# Patient Record
Sex: Male | Born: 1965 | Race: White | Hispanic: No | Marital: Married | Smoking: Never smoker
Health system: Southern US, Community
[De-identification: ages and names within clinical notes are randomized; demographics above are authoritative.]

---

## 1999-06-01 ENCOUNTER — Encounter: Payer: Self-pay | Admitting: Family Medicine

## 1999-06-01 ENCOUNTER — Encounter: Admission: RE | Admit: 1999-06-01 | Discharge: 1999-06-01 | Payer: Self-pay | Admitting: Family Medicine

## 2001-08-06 ENCOUNTER — Encounter: Admission: RE | Admit: 2001-08-06 | Discharge: 2001-08-06 | Payer: Self-pay | Admitting: Family Medicine

## 2001-08-06 ENCOUNTER — Encounter: Payer: Self-pay | Admitting: Family Medicine

## 2006-12-19 ENCOUNTER — Ambulatory Visit: Payer: Self-pay | Admitting: Internal Medicine

## 2006-12-25 ENCOUNTER — Ambulatory Visit: Payer: Self-pay | Admitting: Internal Medicine

## 2010-05-18 NOTE — Assessment & Plan Note (Signed)
[facility]                         GASTROENTEROLOGY OFFICE NOTE   David, Rojas                         MRN:          [id number]  DATE:12/19/2006                            DOB:          1965/04/29    REFERRING PHYSICIAN:  Loraine Leriche C. Vernie Ammons, M.D.   REASON FOR CONSULTATION:  Rectal bleeding.   HISTORY:  This is a pleasant 45 year old white male with no significant  past medical history.  He is referred through the courtesy of Dr.  Vernie Ammons regarding minor rectal bleeding.  The patient reports having had  problems with left hip pain for several months.  He subsequently  developed a change in his urinary flow pattern.  He saw Dr. Vernie Ammons and  was diagnosed with prostatitis.  He was placed on ciprofloxacin.  Rectal  exam at that time was unremarkable.  The patient reports about 3 weeks  ago noticing blood mixed with his stool.  This occurred 2 to 3 times  over the course of a week.  There was no associated change in bowel  habits, difficulty with bowel habits, rectal or abdominal pain.  His  appetite and weight have been stable.  He has had no bleeding for the  past few weeks.  No prior history of GI problems or GI evaluation.   PAST MEDICAL HISTORY:  None.   PAST SURGICAL HISTORY:  None.   ALLERGIES:  No known drug allergies.   CURRENT MEDICATIONS:  Cipro 500 mg b.i.d.   FAMILY HISTORY:  Paternal grandmother with colon cancer in her 14s.   SOCIAL HISTORY:  The patient is married with 4 children.  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  He smokes less than a pack of  cigarettes a day and has an alcoholic beverage with dinner.   REVIEW OF SYSTEMS:  Per diagnostic evaluation form.   PHYSICAL EXAM:  Well-appearing male in no acute distress.  Blood pressure is 120/78, heart rate is 68, weight 209.4 pounds.  He is  5 feet 8 inches in height.  HEENT:  Sclerae  anicteric.  Conjunctivae pink.  Oral mucosa is intact.  No adenopathy.  LUNGS:  Clear.  HEART:  Regular.  ABDOMEN:  Soft without tenderness, mass, or hernia.  RECTAL:  Per Dr. Vernie Ammons, negative, not repeated.   IMPRESSION:  This is a 45 year old gentleman with a several-day history  of blood mixed with stool.  No other associated symptoms.  This may be  due to internal hemorrhoids.  However, neoplasia must be excluded.   RECOMMENDATIONS:  Colonoscopy with polypectomy if necessary.  The nature  of the procedure, as well as the risks, benefits, and alternatives have  been reviewed in detail.  He understood and agreed to proceed.     Wilhemina Bonito. Marina Goodell, MD  Electronically Signed    JNP/MedQ  DD: 12/19/2006  DT: 12/20/2006  Job #: [id number]   cc:   Veverly Fells. Vernie Ammons, M.D.

## 2014-04-14 ENCOUNTER — Ambulatory Visit (INDEPENDENT_AMBULATORY_CARE_PROVIDER_SITE_OTHER): Payer: PRIVATE HEALTH INSURANCE | Admitting: Urgent Care

## 2014-04-14 ENCOUNTER — Ambulatory Visit (INDEPENDENT_AMBULATORY_CARE_PROVIDER_SITE_OTHER): Payer: PRIVATE HEALTH INSURANCE

## 2014-04-14 VITALS — BP 128/80 | HR 64 | Temp 98.0°F | Resp 24 | Ht 67.5 in | Wt 205.2 lb

## 2014-04-14 DIAGNOSIS — S50811A Abrasion of right forearm, initial encounter: Secondary | ICD-10-CM | POA: Diagnosis not present

## 2014-04-14 DIAGNOSIS — S59911A Unspecified injury of right forearm, initial encounter: Secondary | ICD-10-CM | POA: Diagnosis not present

## 2014-04-14 DIAGNOSIS — S8011XA Contusion of right lower leg, initial encounter: Secondary | ICD-10-CM

## 2014-04-14 DIAGNOSIS — M79651 Pain in right thigh: Secondary | ICD-10-CM | POA: Diagnosis not present

## 2014-04-14 DIAGNOSIS — M79631 Pain in right forearm: Secondary | ICD-10-CM

## 2014-04-14 DIAGNOSIS — M62838 Other muscle spasm: Secondary | ICD-10-CM

## 2014-04-14 DIAGNOSIS — M6248 Contracture of muscle, other site: Secondary | ICD-10-CM | POA: Diagnosis not present

## 2014-04-14 DIAGNOSIS — S40021A Contusion of right upper arm, initial encounter: Secondary | ICD-10-CM

## 2014-04-14 MED ORDER — CYCLOBENZAPRINE HCL 5 MG PO TABS: 5.0000 mg | ORAL_TABLET | Freq: Three times a day (TID) | ORAL | Status: AC | PRN

## 2014-04-14 MED ORDER — MELOXICAM 7.5 MG PO TABS: 7.5000 mg | ORAL_TABLET | Freq: Every day | ORAL | Status: AC

## 2014-04-14 NOTE — Patient Instructions (Signed)
Contusion °A contusion is a deep bruise. Contusions are the result of an injury that caused bleeding under the skin. The contusion may turn blue, purple, or yellow. Minor injuries will give you a painless contusion, but more severe contusions may stay painful and swollen for a few weeks.  °CAUSES  °A contusion is usually caused by a blow, trauma, or direct force to an area of the body. °SYMPTOMS  °· Swelling and redness of the injured area. °· Bruising of the injured area. °· Tenderness and soreness of the injured area. °· Pain. °DIAGNOSIS  °The diagnosis can be made by taking a history and physical exam. An X-ray, CT scan, or MRI may be needed to determine if there were any associated injuries, such as fractures. °TREATMENT  °Specific treatment will depend on what area of the body was injured. In general, the best treatment for a contusion is resting, icing, elevating, and applying cold compresses to the injured area. Over-the-counter medicines may also be recommended for pain control. Ask your caregiver what the best treatment is for your contusion. °HOME CARE INSTRUCTIONS  °· Put ice on the injured area. °¨ Put ice in a plastic bag. °¨ Place a towel between your skin and the bag. °¨ Leave the ice on for 15-20 minutes, 3-4 times a day, or as directed by your health care provider. °· Only take over-the-counter or prescription medicines for pain, discomfort, or fever as directed by your caregiver. Your caregiver may recommend avoiding anti-inflammatory medicines (aspirin, ibuprofen, and naproxen) for 48 hours because these medicines may increase bruising. °· Rest the injured area. °· If possible, elevate the injured area to reduce swelling. °SEEK IMMEDIATE MEDICAL CARE IF:  °· You have increased bruising or swelling. °· You have pain that is getting worse. °· Your swelling or pain is not relieved with medicines. °MAKE SURE YOU:  °· Understand these instructions. °· Will watch your condition. °· Will get help right  away if you are not doing well or get worse. °Document Released: 09/29/2004 Document Revised: 12/25/2012 Document Reviewed: 10/25/2010 °[employer]® Patient Information ©2015 [employer]. This information is not intended to replace advice given to you by your health care provider. Make sure you discuss any questions you have with your health care provider. ° °

## 2014-04-14 NOTE — Progress Notes (Signed)
MRN: [id number] DOB: 04/01/1965  Subjective:   David LiesBrian A Rojas is a 49 y.o. male presenting for chief complaint of Arm Injury and Leg Injury  Reports 4 day history of right forearm pain, right leg pain sustained from fall. Patient states that he was fixing trim on house outdoors, was on ladder about 15 feet high, the ladder slipped and he fell with/on top of the ladder. He made impact with the ladder and floor with his right forearm and leg. He felt immediate sharp pain, had significant swelling; had subsequent neck soreness, bruising in the following days. He also sustained a cut to his right forearm which his wife has helped him keep clean and dry, has used soap, water, hydrogen peroxide and Neosporin. He used ice initially for swelling of forearm and leg, has tried ibuprofen with some relief of pain. Denies head trauma, confusion, loss of consciousness, popping or snapping sounds, bony deformities, decreased sensation in right arm and leg, decreased strength, back pain, hip pain, knee pain, ankle pain, elbow pain, shoulder pain. Also denies drainage of pus or blood from forearm cut, no erythema, streaking, fever. Denies any other aggravating or relieving factors, no other questions or concerns.  David JohnBrian currently has no medications in their medication list. He  has No Known Allergies.  David JohnBrian  has no past medical history on file. Also  has no past surgical history on file.  ROS As in subjective.  Objective:   Vitals: BP 128/80 mmHg  Pulse 64  Temp(Src) 98 F (36.7 C) (Oral)  Resp 24  Ht 5' 7.5" (1.715 m)  Wt 205 lb 3 oz (93.072 kg)  BMI 31.64 kg/m2  SpO2 97%  Physical Exam  Constitutional: He is oriented to person, place, and time and well-developed, well-nourished, and in no distress.  Cardiovascular: Normal rate.   Pulmonary/Chest: Effort normal.  Musculoskeletal:       Right elbow: He exhibits normal range of motion, no swelling, no effusion, no deformity and no laceration. No  tenderness found.       Right wrist: He exhibits tenderness (mild with palpation over abrasion as depicted) and swelling (trace edema surrounding abrasion). He exhibits normal range of motion, no bony tenderness, no effusion, no crepitus, no deformity and no laceration.       Cervical back: He exhibits spasm. He exhibits normal range of motion, no tenderness, no bony tenderness, no swelling, no edema, no deformity and no laceration.       Thoracic back: He exhibits normal range of motion, no tenderness, no bony tenderness, no swelling, no edema, no deformity, no laceration and no spasm.       Lumbar back: He exhibits normal range of motion, no tenderness, no bony tenderness, no swelling, no edema, no deformity, no laceration and no spasm.       Arms: Strength 5/5, sensation intact.  Neurological: He is alert and oriented to person, place, and time. He has normal reflexes.  Skin: Skin is warm and dry. No rash noted. No erythema. No pallor.  Psychiatric: Mood and affect normal.   [facility] reading (PRIMARY) by  Dr. Clelia CroftShaw and PA-Amana Bouska. Right forearm: No evidence of fracture.  Dg Forearm Right  04/14/2014   CLINICAL DATA:  Right forearm pain after injury. Fall from ladder 4 days prior injuring right forearm. Initial encounter.  EXAM: RIGHT FOREARM - 2 VIEW  COMPARISON:  None.  FINDINGS: Cortical margins of the radius and ulna are intact. There is no fracture. Wrist and elbow  alignment is maintained. No focal soft tissue abnormality.  IMPRESSION: Intact right forearm, no fracture.   Electronically Signed   By: Rubye Oaks M.D.   On: 04/14/2014 21:44   Assessment and Plan :   1. Right forearm injury, initial encounter 2. Right forearm pain 3. Muscle spasms of neck 4. Right thigh pain 5. Arm contusion, right, initial encounter 6. Contusion of right leg, initial encounter 7. Forearm abrasion, right, initial encounter - Advised conservative management with meloxicam and flexeril - Anticipatory  guidance provided, return to clinic in 2 weeks if symptoms fail to improve.  Wallis Bamberg, PA-C [facility] [phone] 04/14/2014 8:46 PM

## 2016-08-13 IMAGING — CR DG FOREARM 2V*R*
2 series · 2 of 2 positions shown · non-contrast
Comparison: None.

CLINICAL DATA: Right forearm pain after injury. Fall from ladder 4
days prior injuring right forearm. Initial encounter.

EXAM:
RIGHT FOREARM - 2 VIEW

[AP]
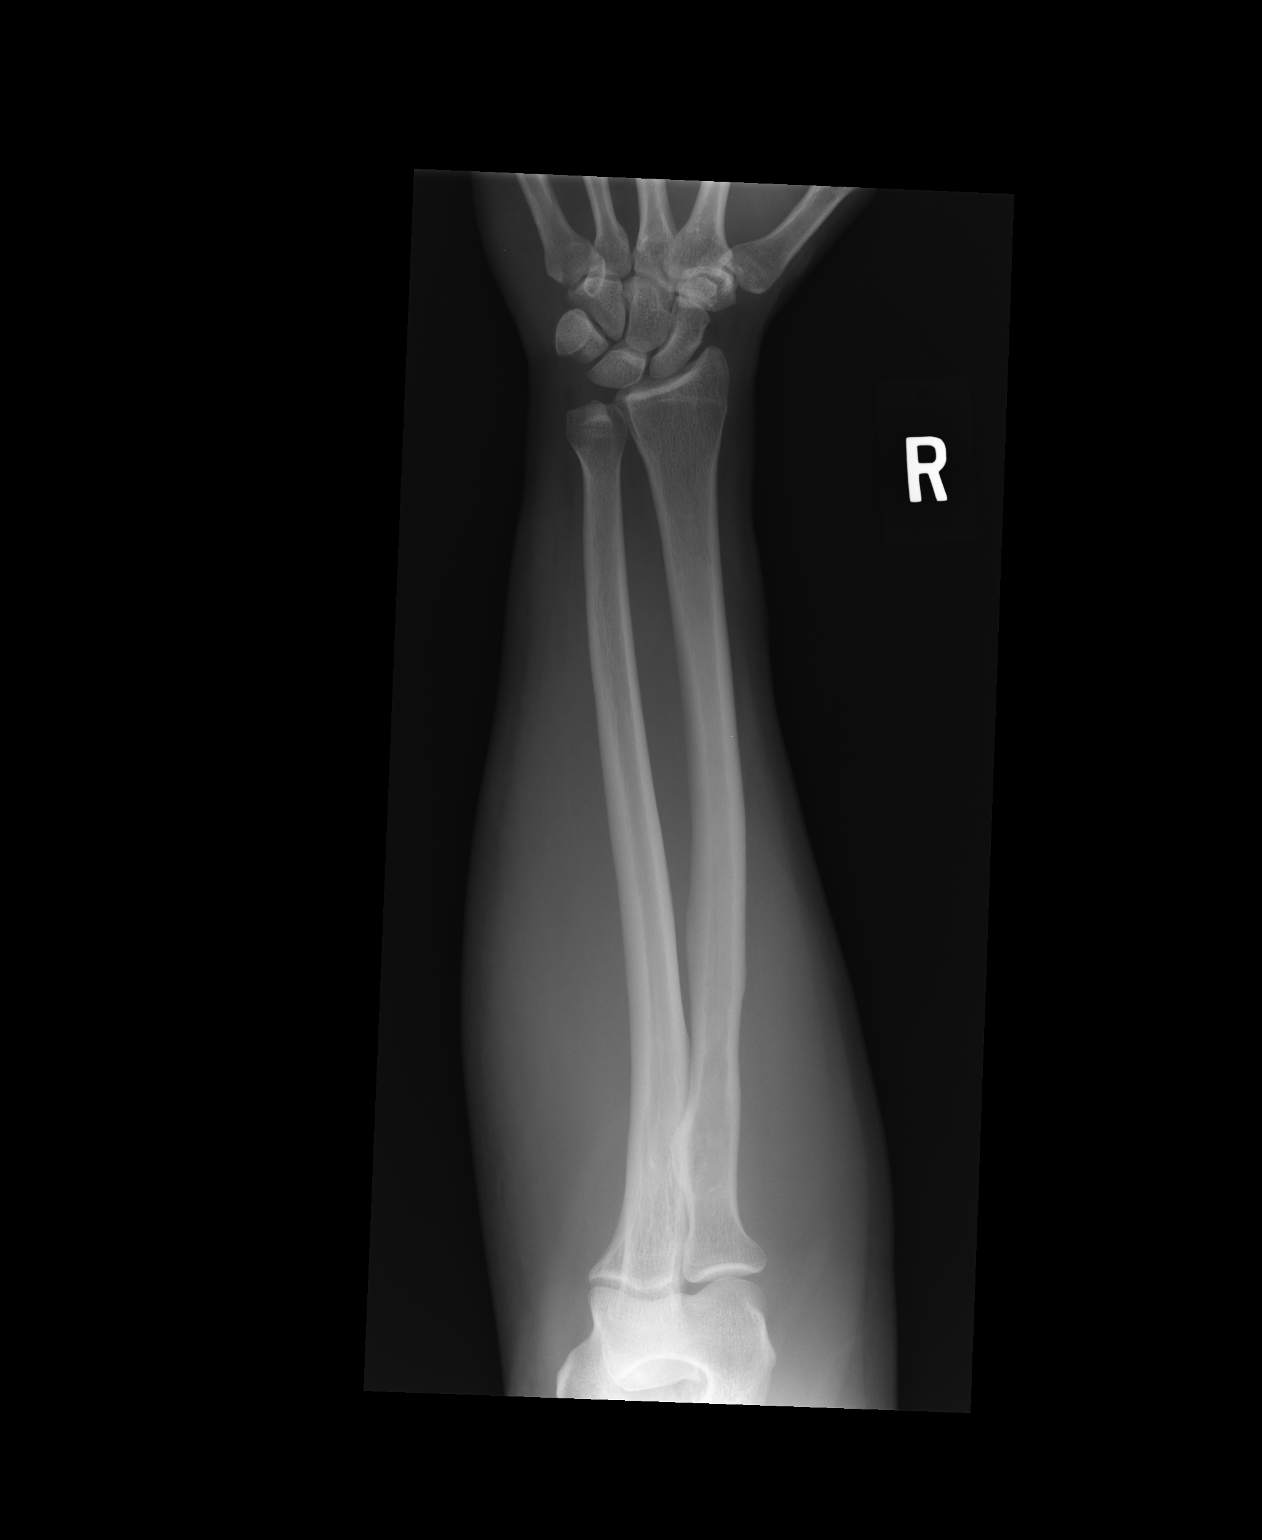

[lateral]
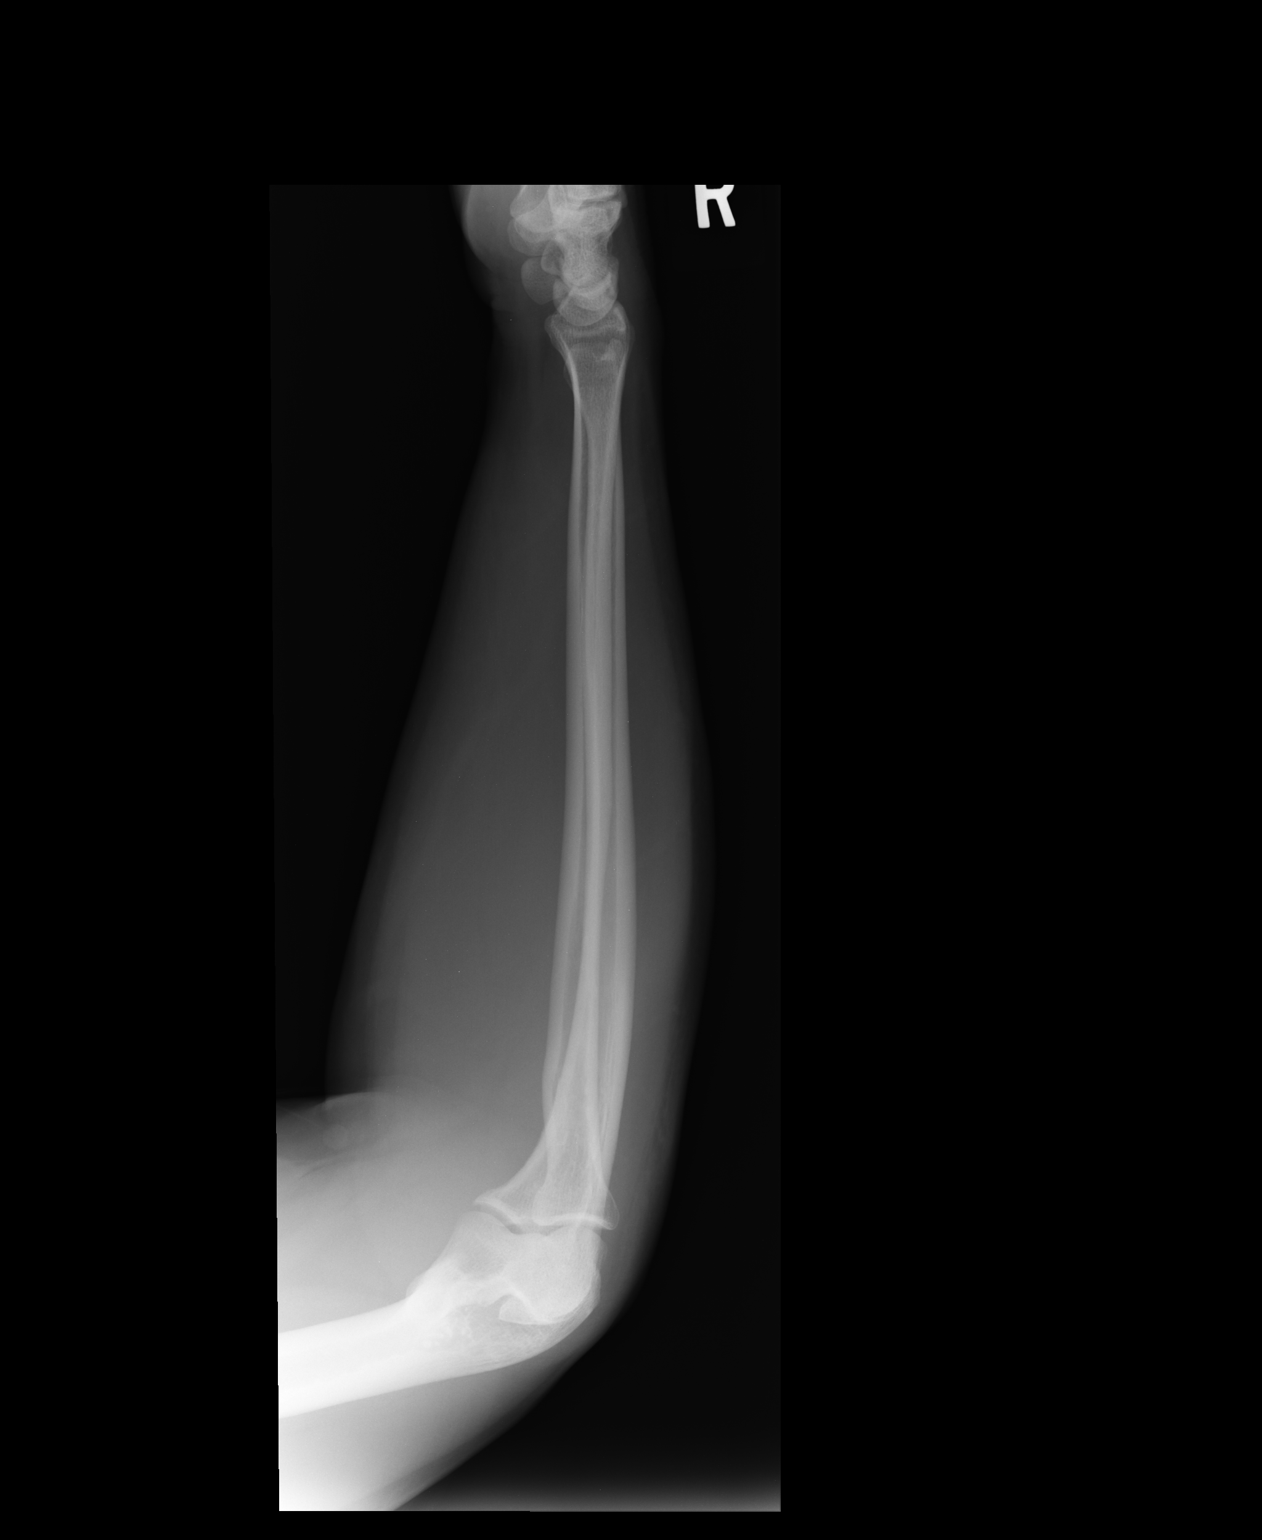

[2 of 2 positions shown; findings below may reference images not displayed]

FINDINGS: Cortical margins of the radius and ulna are intact. There is no
fracture. Wrist and elbow alignment is maintained. No focal soft
tissue abnormality.
IMPRESSION: Intact right forearm, no fracture.

## 2017-01-04 ENCOUNTER — Encounter: Payer: Self-pay | Admitting: Internal Medicine

## 2019-08-06 DIAGNOSIS — E785 Hyperlipidemia, unspecified: Secondary | ICD-10-CM | POA: Diagnosis not present

## 2019-08-06 DIAGNOSIS — R7303 Prediabetes: Secondary | ICD-10-CM | POA: Diagnosis not present

## 2019-09-06 DIAGNOSIS — R7303 Prediabetes: Secondary | ICD-10-CM | POA: Diagnosis not present

## 2019-09-06 DIAGNOSIS — E785 Hyperlipidemia, unspecified: Secondary | ICD-10-CM | POA: Diagnosis not present

## 2019-12-11 DIAGNOSIS — D3132 Benign neoplasm of left choroid: Secondary | ICD-10-CM | POA: Diagnosis not present

## 2019-12-11 DIAGNOSIS — H5213 Myopia, bilateral: Secondary | ICD-10-CM | POA: Diagnosis not present

## 2020-02-28 DIAGNOSIS — R634 Abnormal weight loss: Secondary | ICD-10-CM | POA: Diagnosis not present

## 2020-02-28 DIAGNOSIS — E785 Hyperlipidemia, unspecified: Secondary | ICD-10-CM | POA: Diagnosis not present

## 2020-02-28 DIAGNOSIS — Z0000 Encounter for general adult medical examination without abnormal findings: Secondary | ICD-10-CM | POA: Diagnosis not present

## 2020-02-28 DIAGNOSIS — R7303 Prediabetes: Secondary | ICD-10-CM | POA: Diagnosis not present

## 2020-02-28 DIAGNOSIS — Z125 Encounter for screening for malignant neoplasm of prostate: Secondary | ICD-10-CM | POA: Diagnosis not present

## 2020-04-08 DIAGNOSIS — R7303 Prediabetes: Secondary | ICD-10-CM | POA: Diagnosis not present

## 2020-04-08 DIAGNOSIS — Z0000 Encounter for general adult medical examination without abnormal findings: Secondary | ICD-10-CM | POA: Diagnosis not present

## 2020-04-08 DIAGNOSIS — E669 Obesity, unspecified: Secondary | ICD-10-CM | POA: Diagnosis not present

## 2020-04-08 DIAGNOSIS — E785 Hyperlipidemia, unspecified: Secondary | ICD-10-CM | POA: Diagnosis not present

## 2020-04-08 DIAGNOSIS — N529 Male erectile dysfunction, unspecified: Secondary | ICD-10-CM | POA: Diagnosis not present

## 2020-06-15 DIAGNOSIS — D1801 Hemangioma of skin and subcutaneous tissue: Secondary | ICD-10-CM | POA: Diagnosis not present

## 2020-06-15 DIAGNOSIS — L821 Other seborrheic keratosis: Secondary | ICD-10-CM | POA: Diagnosis not present

## 2020-06-15 DIAGNOSIS — L814 Other melanin hyperpigmentation: Secondary | ICD-10-CM | POA: Diagnosis not present

## 2020-06-15 DIAGNOSIS — D225 Melanocytic nevi of trunk: Secondary | ICD-10-CM | POA: Diagnosis not present

## 2020-10-02 DIAGNOSIS — R7303 Prediabetes: Secondary | ICD-10-CM | POA: Diagnosis not present

## 2020-10-02 DIAGNOSIS — Z79899 Other long term (current) drug therapy: Secondary | ICD-10-CM | POA: Diagnosis not present

## 2020-10-02 DIAGNOSIS — E785 Hyperlipidemia, unspecified: Secondary | ICD-10-CM | POA: Diagnosis not present

## 2020-10-09 DIAGNOSIS — E669 Obesity, unspecified: Secondary | ICD-10-CM | POA: Diagnosis not present

## 2020-10-09 DIAGNOSIS — R7303 Prediabetes: Secondary | ICD-10-CM | POA: Diagnosis not present

## 2020-10-09 DIAGNOSIS — Z79899 Other long term (current) drug therapy: Secondary | ICD-10-CM | POA: Diagnosis not present

## 2020-10-09 DIAGNOSIS — E785 Hyperlipidemia, unspecified: Secondary | ICD-10-CM | POA: Diagnosis not present

## 2021-01-05 DIAGNOSIS — H5213 Myopia, bilateral: Secondary | ICD-10-CM | POA: Diagnosis not present

## 2021-01-05 DIAGNOSIS — D3131 Benign neoplasm of right choroid: Secondary | ICD-10-CM | POA: Diagnosis not present

## 2021-01-05 DIAGNOSIS — E119 Type 2 diabetes mellitus without complications: Secondary | ICD-10-CM | POA: Diagnosis not present

## 2021-01-27 DIAGNOSIS — L923 Foreign body granuloma of the skin and subcutaneous tissue: Secondary | ICD-10-CM | POA: Diagnosis not present

## 2021-04-13 DIAGNOSIS — R7301 Impaired fasting glucose: Secondary | ICD-10-CM | POA: Diagnosis not present

## 2021-04-13 DIAGNOSIS — Z6835 Body mass index (BMI) 35.0-35.9, adult: Secondary | ICD-10-CM | POA: Diagnosis not present

## 2021-04-13 DIAGNOSIS — E7800 Pure hypercholesterolemia, unspecified: Secondary | ICD-10-CM | POA: Diagnosis not present

## 2021-04-13 DIAGNOSIS — Z0001 Encounter for general adult medical examination with abnormal findings: Secondary | ICD-10-CM | POA: Diagnosis not present

## 2021-04-13 DIAGNOSIS — R351 Nocturia: Secondary | ICD-10-CM | POA: Diagnosis not present

## 2021-06-16 DIAGNOSIS — D2271 Melanocytic nevi of right lower limb, including hip: Secondary | ICD-10-CM | POA: Diagnosis not present

## 2021-06-16 DIAGNOSIS — L570 Actinic keratosis: Secondary | ICD-10-CM | POA: Diagnosis not present

## 2021-06-16 DIAGNOSIS — D2262 Melanocytic nevi of left upper limb, including shoulder: Secondary | ICD-10-CM | POA: Diagnosis not present

## 2021-06-16 DIAGNOSIS — D2261 Melanocytic nevi of right upper limb, including shoulder: Secondary | ICD-10-CM | POA: Diagnosis not present

## 2021-06-16 DIAGNOSIS — D225 Melanocytic nevi of trunk: Secondary | ICD-10-CM | POA: Diagnosis not present

## 2021-07-28 DIAGNOSIS — M25522 Pain in left elbow: Secondary | ICD-10-CM | POA: Diagnosis not present

## 2021-09-20 DIAGNOSIS — R7301 Impaired fasting glucose: Secondary | ICD-10-CM | POA: Diagnosis not present

## 2021-09-20 DIAGNOSIS — E7800 Pure hypercholesterolemia, unspecified: Secondary | ICD-10-CM | POA: Diagnosis not present

## 2021-09-20 DIAGNOSIS — M25522 Pain in left elbow: Secondary | ICD-10-CM | POA: Diagnosis not present
